# Patient Record
Sex: Female | Born: 1980 | Hispanic: No | Marital: Married | Smoking: Never smoker
Health system: Southern US, Community
[De-identification: ages and names within clinical notes are randomized; demographics above are authoritative.]

## PROBLEM LIST (undated history)

## (undated) ENCOUNTER — Inpatient Hospital Stay (HOSPITAL_COMMUNITY): Payer: Self-pay

## (undated) DIAGNOSIS — O24419 Gestational diabetes mellitus in pregnancy, unspecified control: Secondary | ICD-10-CM

## (undated) DIAGNOSIS — O3680X0 Pregnancy with inconclusive fetal viability, not applicable or unspecified: Secondary | ICD-10-CM

## (undated) DIAGNOSIS — E039 Hypothyroidism, unspecified: Secondary | ICD-10-CM

## (undated) HISTORY — PX: NO PAST SURGERIES: SHX2092

## (undated) HISTORY — DX: Pregnancy with inconclusive fetal viability, not applicable or unspecified: O36.80X0

---

## 2017-09-21 ENCOUNTER — Encounter (HOSPITAL_COMMUNITY): Payer: Self-pay | Admitting: *Deleted

## 2017-09-21 ENCOUNTER — Inpatient Hospital Stay (HOSPITAL_COMMUNITY): Payer: Medicaid Other

## 2017-09-21 ENCOUNTER — Inpatient Hospital Stay (HOSPITAL_COMMUNITY)
Admission: AD | Admit: 2017-09-21 | Discharge: 2017-09-21 | Disposition: A | Discharge status: Home / Self Care | Payer: Medicaid Other | Source: Ambulatory Visit | Attending: Obstetrics & Gynecology | Admitting: Obstetrics & Gynecology

## 2017-09-21 DIAGNOSIS — Z3A01 Less than 8 weeks gestation of pregnancy: Secondary | ICD-10-CM | POA: Diagnosis not present

## 2017-09-21 DIAGNOSIS — O26851 Spotting complicating pregnancy, first trimester: Secondary | ICD-10-CM | POA: Diagnosis not present

## 2017-09-21 DIAGNOSIS — O283 Abnormal ultrasonic finding on antenatal screening of mother: Secondary | ICD-10-CM | POA: Diagnosis not present

## 2017-09-21 DIAGNOSIS — O3680X0 Pregnancy with inconclusive fetal viability, not applicable or unspecified: Secondary | ICD-10-CM

## 2017-09-21 DIAGNOSIS — O209 Hemorrhage in early pregnancy, unspecified: Secondary | ICD-10-CM | POA: Diagnosis present

## 2017-09-21 DIAGNOSIS — O26859 Spotting complicating pregnancy, unspecified trimester: Secondary | ICD-10-CM | POA: Diagnosis present

## 2017-09-21 HISTORY — DX: Hypothyroidism, unspecified: E03.9

## 2017-09-21 HISTORY — DX: Pregnancy with inconclusive fetal viability, not applicable or unspecified: O36.80X0

## 2017-09-21 HISTORY — DX: Gestational diabetes mellitus in pregnancy, unspecified control: O24.419

## 2017-09-21 LAB — URINALYSIS, ROUTINE W REFLEX MICROSCOPIC
BACTERIA UA: NONE SEEN
Bilirubin Urine: NEGATIVE
GLUCOSE, UA: NEGATIVE mg/dL
Ketones, ur: NEGATIVE mg/dL
Leukocytes, UA: NEGATIVE
Nitrite: NEGATIVE
PH: 6 (ref 5.0–8.0)
Protein, ur: NEGATIVE mg/dL
Specific Gravity, Urine: 1.006 (ref 1.005–1.030)

## 2017-09-21 LAB — CBC
HCT: 34.9 % — ABNORMAL LOW (ref 36.0–46.0)
Hemoglobin: 11.4 g/dL — ABNORMAL LOW (ref 12.0–15.0)
MCH: 29 pg (ref 26.0–34.0)
MCHC: 32.7 g/dL (ref 30.0–36.0)
MCV: 88.8 fL (ref 78.0–100.0)
PLATELETS: 353 10*3/uL (ref 150–400)
RBC: 3.93 MIL/uL (ref 3.87–5.11)
RDW: 13.3 % (ref 11.5–15.5)
WBC: 8.8 10*3/uL (ref 4.0–10.5)

## 2017-09-21 LAB — POCT PREGNANCY, URINE: PREG TEST UR: POSITIVE — AB

## 2017-09-21 LAB — WET PREP, GENITAL
CLUE CELLS WET PREP: NONE SEEN
Sperm: NONE SEEN
TRICH WET PREP: NONE SEEN
Yeast Wet Prep HPF POC: NONE SEEN

## 2017-09-21 LAB — HCG, QUANTITATIVE, PREGNANCY: hCG, Beta Chain, Quant, S: 12527 m[IU]/mL — ABNORMAL HIGH (ref <5)

## 2017-09-21 LAB — ABO/RH: ABO/RH(D): AB POS

## 2017-09-21 NOTE — Discharge Instructions (Signed)
Please return to Maternity Admissions Unit for bleeding where you completely saturate a 1-2 pads every hour for a 2 hour period.

## 2017-09-21 NOTE — MAU Note (Signed)
Pt reports yesterday she had some pink discharge on the tissue when she wiped, the same thing happened twice today. Denies pain.

## 2017-09-21 NOTE — MAU Provider Note (Signed)
History     CSN: [id number]  Arrival date and time: 09/21/17 1445   First Provider Initiated Contact with Patient 09/21/17 1543      Chief Complaint  Patient presents with  . Possible Pregnancy  . Vaginal Bleeding   HPI  Ms.  Gloria Mendez is a 37 y.o. year old G35P0011 female at [redacted]w[redacted]d weeks gestation by LMP who presents to MAU reporting "some pink spotting yesterday with wiping, the same thing happened twice today, but is heavier than yesterday". She denies pain. She denies recent SI; last in mid-March.  Past Medical History:  Diagnosis Date  . Gestational diabetes   . Hypothyroidism     Past Surgical History:  Procedure Laterality Date  . NO PAST SURGERIES      History reviewed. No pertinent family history.  Social History   Tobacco Use  . Smoking status: Never Smoker  . Smokeless tobacco: Never Used  Substance Use Topics  . Alcohol use: Never    Frequency: Never  . Drug use: Never    Allergies: No Known Allergies  Medications Prior to Admission  Medication Sig Dispense Refill Last Dose  . levothyroxine (SYNTHROID, LEVOTHROID) 25 MCG tablet Take 25 mcg by mouth daily.  1 09/21/2017 at Unknown time  . Prenatal Vit-Fe Fumarate-FA (PRENATAL MULTIVITAMIN) TABS tablet Take 1 tablet by mouth daily at 12 noon.   09/20/2017 at Unknown time    Review of Systems  Constitutional: Negative.   HENT: Negative.   Eyes: Negative.   Respiratory: Negative.   Cardiovascular: Negative.   Gastrointestinal: Negative.   Endocrine: Negative.   Genitourinary: Positive for vaginal bleeding (pink spotting).  Musculoskeletal: Negative.   Skin: Negative.   Allergic/Immunologic: Negative.   Neurological: Negative.   Hematological: Negative.   Psychiatric/Behavioral: Negative.    Physical Exam   Blood pressure 111/62, pulse (!) 106, temperature 98.2 F (36.8 C), temperature source Oral, resp. rate 16, last menstrual period 08/15/2017, SpO2 100 %.  Physical Exam  Nursing note and  vitals reviewed. Constitutional: She is oriented to person, place, and time. She appears well-developed and well-nourished.  HENT:  Head: Normocephalic and atraumatic.  Eyes: Pupils are equal, round, and reactive to light.  Neck: Normal range of motion.  Cardiovascular: Normal rate, regular rhythm and normal heart sounds.  Respiratory: Effort normal and breath sounds normal.  GI: Soft. Bowel sounds are normal.  Genitourinary:  Genitourinary Comments: Uterus: non-tender, SE: cervix is smooth, pink, no lesions, scant amt of clear vaginal d/c -- WP, GC/CT done, closed/long/firm, no CMT or friability, no adnexal tenderness   Musculoskeletal: Normal range of motion.  Neurological: She is alert and oriented to person, place, and time.  Skin: Skin is warm and dry.  Psychiatric: She has a normal mood and affect. Her behavior is normal. Judgment and thought content normal.    MAU Course  Procedures  MDM CCUA UPT CBC w/Diff ABO/Rh Wet Prep GC/CT -- pending HIV -- pending  Results for orders placed or performed during the hospital encounter of 09/21/17 (from the past 24 hour(s))  Urinalysis, Routine w reflex microscopic     Status: Abnormal   Collection Time: 09/21/17  3:02 PM  Result Value Ref Range   Color, Urine STRAW (A) YELLOW   APPearance CLEAR CLEAR   Specific Gravity, Urine 1.006 1.005 - 1.030   pH 6.0 5.0 - 8.0   Glucose, UA NEGATIVE NEGATIVE mg/dL   Hgb urine dipstick SMALL (A) NEGATIVE   Bilirubin Urine NEGATIVE NEGATIVE  Ketones, ur NEGATIVE NEGATIVE mg/dL   Protein, ur NEGATIVE NEGATIVE mg/dL   Nitrite NEGATIVE NEGATIVE   Leukocytes, UA NEGATIVE NEGATIVE   RBC / HPF 0-5 0 - 5 RBC/hpf   WBC, UA 0-5 0 - 5 WBC/hpf   Bacteria, UA NONE SEEN NONE SEEN   Squamous Epithelial / LPF 0-5 (A) NONE SEEN  Pregnancy, urine POC     Status: Abnormal   Collection Time: 09/21/17  3:51 PM  Result Value Ref Range   Preg Test, Ur POSITIVE (A) NEGATIVE  Wet prep, genital     Status:  Abnormal   Collection Time: 09/21/17  3:55 PM  Result Value Ref Range   Yeast Wet Prep HPF POC NONE SEEN NONE SEEN   Trich, Wet Prep NONE SEEN NONE SEEN   Clue Cells Wet Prep HPF POC NONE SEEN NONE SEEN   WBC, Wet Prep HPF POC FEW (A) NONE SEEN   Sperm NONE SEEN   CBC     Status: Abnormal   Collection Time: 09/21/17  4:04 PM  Result Value Ref Range   WBC 8.8 4.0 - 10.5 K/uL   RBC 3.93 3.87 - 5.11 MIL/uL   Hemoglobin 11.4 (L) 12.0 - 15.0 g/dL   HCT 21.3 (L) 08.6 - 57.8 %   MCV 88.8 78.0 - 100.0 fL   MCH 29.0 26.0 - 34.0 pg   MCHC 32.7 30.0 - 36.0 g/dL   RDW 46.9 62.9 - 52.8 %   Platelets 353 150 - 400 K/uL  ABO/Rh     Status: None (Preliminary result)   Collection Time: 09/21/17  4:04 PM  Result Value Ref Range   ABO/RH(D)      AB POS Performed at [facility], [address], [place], [place] [address]   hCG, quantitative, pregnancy     Status: Abnormal   Collection Time: 09/21/17  4:04 PM  Result Value Ref Range   hCG, Beta Chain, Quant, S 12,527 (H) <5 mIU/mL    US Ob Less Than 14 Weeks With Ob Transvaginal  Result Date: 09/21/2017 CLINICAL DATA:  Pregnant patient with vaginal spotting. Patient is 5 weeks and 2 days pregnant based on her last menstrual period. EXAM: OBSTETRIC <14 WK [place] AND TRANSVAGINAL OB US TECHNIQUE: Both transabdominal and transvaginal ultrasound examinations were performed for complete evaluation of the gestation as well as the maternal uterus, adnexal regions, and pelvic cul-de-sac. Transvaginal technique was performed to assess early pregnancy. COMPARISON:  None. FINDINGS: Intrauterine gestational sac: Single Yolk sac:  Visualized. Embryo:  Not Visualized. Cardiac Activity: Not Visualized. MSD: 8.3 mm mm   5 w   4 d Subchorionic hemorrhage:  None visualized. Maternal uterus/adnexae: No uterine masses. Cervix is closed. Left ovary demonstrates a corpus luteum. Ovaries otherwise unremarkable. No adnexal masses. No pelvic free fluid. IMPRESSION: 1.  Intrauterine gestational sac containing a yolk sac consistent with an early intrauterine pregnancy. Embryo not visualized. Recommend follow-up ultrasound in 7-10 days to document normal pregnancy progression. 2. No subchronic hemorrhage or other evidence of a pregnancy complication. Electronically Signed   By: Amie Portland M.D.   On: 09/21/2017 16:37    Assessment and Plan  Pregnancy of unknown anatomic location  - Information provided on ectopic pregnancy - F/U with [facility] on Wednesday 09/24/17 @ 0900 for repeat HCG - Advised to arrive at 8:45 AM and expect to wait for at least 2 hrs for results and to speak to someone about POC  Spotting affecting pregnancy in first trimester  -  Information provided on threatened miscarriage Advised to return to MAU for bleeding that saturates 1-2 pads eery hour x 2 hrs  - Discharge patient - Patient verbalized an understanding of the plan of care and agrees.   Raelyn Moraolitta Elida Harbin MSN, CNM 09/21/2017, 3:49 PM

## 2017-09-22 LAB — HIV ANTIBODY (ROUTINE TESTING W REFLEX): HIV SCREEN 4TH GENERATION: NONREACTIVE

## 2017-09-22 LAB — GC/CHLAMYDIA PROBE AMP (~~LOC~~) NOT AT ARMC
CHLAMYDIA, DNA PROBE: NEGATIVE
NEISSERIA GONORRHEA: NEGATIVE

## 2017-09-22 LAB — POCT PREGNANCY, URINE: PREG TEST UR: POSITIVE — AB

## 2017-09-24 ENCOUNTER — Ambulatory Visit (INDEPENDENT_AMBULATORY_CARE_PROVIDER_SITE_OTHER): Payer: Self-pay

## 2017-09-24 DIAGNOSIS — O4690 Antepartum hemorrhage, unspecified, unspecified trimester: Secondary | ICD-10-CM

## 2017-09-24 LAB — HCG, QUANTITATIVE, PREGNANCY: HCG, BETA CHAIN, QUANT, S: 27588 m[IU]/mL — AB (ref <5)

## 2017-09-24 NOTE — Progress Notes (Signed)
Notified Gloria Mendez, CNM pt's beta results.  Provider recommendation for pt to have US in one week.  Informed pt of provider's recommendation.  April 18th @ 0900.  Pt notified of appt and verbalized understanding with no further questions.

## 2017-09-24 NOTE — Progress Notes (Signed)
Patient here for stat bhcg today. Patient reports bleeding has stopped since MAU visit. Patient denies pain. Discussed with patient we are monitoring her beta hcg levels today and asked she wait in lobby for results/updated plan of care. Patient verbalized understanding & had no questions at this time.

## 2017-10-02 ENCOUNTER — Encounter: Payer: Self-pay | Admitting: Family Medicine

## 2017-10-02 ENCOUNTER — Ambulatory Visit (INDEPENDENT_AMBULATORY_CARE_PROVIDER_SITE_OTHER): Payer: Self-pay | Admitting: General Practice

## 2017-10-02 ENCOUNTER — Ambulatory Visit (HOSPITAL_COMMUNITY)
Admission: RE | Admit: 2017-10-02 | Discharge: 2017-10-02 | Disposition: A | Discharge status: Home / Self Care | Payer: Medicaid Other | Source: Ambulatory Visit

## 2017-10-02 DIAGNOSIS — Z3A01 Less than 8 weeks gestation of pregnancy: Secondary | ICD-10-CM | POA: Diagnosis not present

## 2017-10-02 DIAGNOSIS — O4690 Antepartum hemorrhage, unspecified, unspecified trimester: Secondary | ICD-10-CM | POA: Diagnosis present

## 2017-10-02 DIAGNOSIS — Z712 Person consulting for explanation of examination or test findings: Secondary | ICD-10-CM

## 2017-10-02 NOTE — Progress Notes (Signed)
Patient here for viability results today. Reviewed ultrasound report with Luna KitchensKathryn Kooistra who finds living IUP, patient should begin prenatal care.   Informed patient of results, reviewed dating, & recommended she begin OB care. Provided pictures. Patient verbalized understanding to all & had no questions.

## 2017-10-02 NOTE — Progress Notes (Signed)
Chart reviewed for nurse visit. Agree with plan of care.   Marylene LandKooistra, Kathryn Lorraine, CNM 10/02/2017 10:10 AM

## 2017-10-16 ENCOUNTER — Encounter: Payer: Self-pay | Admitting: General Practice

## 2017-10-16 ENCOUNTER — Other Ambulatory Visit (HOSPITAL_COMMUNITY)
Admission: RE | Admit: 2017-10-16 | Discharge: 2017-10-16 | Disposition: A | Discharge status: Home / Self Care | Payer: Medicaid Other | Source: Ambulatory Visit | Attending: Advanced Practice Midwife | Admitting: Advanced Practice Midwife

## 2017-10-16 ENCOUNTER — Encounter: Payer: Self-pay | Admitting: Advanced Practice Midwife

## 2017-10-16 ENCOUNTER — Ambulatory Visit (INDEPENDENT_AMBULATORY_CARE_PROVIDER_SITE_OTHER): Payer: Medicaid Other | Admitting: Advanced Practice Midwife

## 2017-10-16 VITALS — BP 119/79 | HR 118 | Temp 98.5°F | Ht 60.0 in | Wt 132.6 lb

## 2017-10-16 DIAGNOSIS — O26859 Spotting complicating pregnancy, unspecified trimester: Secondary | ICD-10-CM

## 2017-10-16 DIAGNOSIS — O09529 Supervision of elderly multigravida, unspecified trimester: Secondary | ICD-10-CM

## 2017-10-16 DIAGNOSIS — Z3A08 8 weeks gestation of pregnancy: Secondary | ICD-10-CM | POA: Diagnosis not present

## 2017-10-16 DIAGNOSIS — O0991 Supervision of high risk pregnancy, unspecified, first trimester: Secondary | ICD-10-CM | POA: Diagnosis not present

## 2017-10-16 DIAGNOSIS — O99281 Endocrine, nutritional and metabolic diseases complicating pregnancy, first trimester: Secondary | ICD-10-CM | POA: Insufficient documentation

## 2017-10-16 DIAGNOSIS — O09521 Supervision of elderly multigravida, first trimester: Secondary | ICD-10-CM | POA: Insufficient documentation

## 2017-10-16 DIAGNOSIS — O09291 Supervision of pregnancy with other poor reproductive or obstetric history, first trimester: Secondary | ICD-10-CM

## 2017-10-16 DIAGNOSIS — Z8632 Personal history of gestational diabetes: Secondary | ICD-10-CM

## 2017-10-16 DIAGNOSIS — E039 Hypothyroidism, unspecified: Secondary | ICD-10-CM | POA: Diagnosis not present

## 2017-10-16 DIAGNOSIS — Z79890 Hormone replacement therapy (postmenopausal): Secondary | ICD-10-CM | POA: Insufficient documentation

## 2017-10-16 DIAGNOSIS — O26851 Spotting complicating pregnancy, first trimester: Secondary | ICD-10-CM | POA: Insufficient documentation

## 2017-10-16 DIAGNOSIS — O0990 Supervision of high risk pregnancy, unspecified, unspecified trimester: Secondary | ICD-10-CM

## 2017-10-16 DIAGNOSIS — O09299 Supervision of pregnancy with other poor reproductive or obstetric history, unspecified trimester: Secondary | ICD-10-CM

## 2017-10-16 MED ORDER — PRENATAL FORMULA 27-1 MG PO TABS: 1.0000 | ORAL_TABLET | Freq: Every day | ORAL | 11 refills | Status: AC

## 2017-10-16 NOTE — Progress Notes (Signed)
Subjective:   Gloria Mendez is a 37 y.o. G4P1011 at [redacted]w[redacted]d by LMP, early ultrasound being seen today for her first obstetrical visit.  Her obstetrical history is significant for advanced maternal age and hypothyroid  History of gestational diabetes with last pregnancy. Patient does intend to breast feed. Pregnancy history fully reviewed.   Currently on synthroid q day, getting rx from her Dr in [place], but has not had lab work since 2017.   Patient reports nausea, vomiting and food aversion.  HISTORY: OB History  Gravida Para Term Preterm AB Living  0 1 1  SAB TAB Ectopic Multiple Live Births  1 0 0 0 1    # Outcome Date GA Lbr Len/2nd Weight Sex Delivery Anes PTL Lv  3 Current           2 Term 06/23/15 [redacted]w[redacted]d   F  EPI  LIV     Name: Reeva   1 SAB 10/15/13            Last pap smear was done unsure and was does not recall any past abnormal paps.   Past Medical History:  Diagnosis Date  . Gestational diabetes   . Hypothyroidism   . Pregnancy of unknown anatomic location 09/21/2017   Past Surgical History:  Procedure Laterality Date  . NO PAST SURGERIES     History reviewed. No pertinent family history. Social History   Tobacco Use  . Smoking status: Never Smoker  . Smokeless tobacco: Never Used  Substance Use Topics  . Alcohol use: Never    Frequency: Never  . Drug use: Never   No Known Allergies Current Outpatient Medications on File Prior to Visit  Medication Sig Dispense Refill  . levothyroxine (SYNTHROID, LEVOTHROID) 25 MCG tablet Take 25 mcg by mouth daily.  1  . Prenatal Vit-Fe Fumarate-FA (PRENATAL MULTIVITAMIN) TABS tablet Take 1 tablet by mouth daily at 12 noon.     No current facility-administered medications on file prior to visit.     Review of Systems Pertinent items noted in HPI and remainder of comprehensive ROS otherwise negative.  Exam   Vitals:   10/16/17 1034 10/16/17 1036  BP: 119/79   Pulse: (!) 118   Temp: 98.5 F (36.9  C)   Weight: 132 lb 9.6 oz (60.1 kg)   Height:  5' (1.524 m)      Uterus:   8 weeks size, +FHT 180 with doppler today   Pelvic Exam: Perineum: no hemorrhoids, normal perineum   Vulva: normal external genitalia, no lesions   Vagina:  normal mucosa, normal discharge   Cervix: ? Cervical polyp and some bleeding after pap, pap smear done.    Adnexa: normal adnexa and no mass, fullness, tenderness   Bony Pelvis: average  System: General: well-developed, well-nourished female in no acute distress   Breast:  normal appearance, no masses or tenderness   Skin: normal coloration and turgor, no rashes, large amount of scarring on right arm    Neurologic: oriented, normal, negative, normal mood   Extremities: normal strength, tone, and muscle mass, ROM of all joints is normal   HEENT PERRLA, extraocular movement intact and sclera clear, anicteric   Mouth/Teeth mucous membranes moist, pharynx normal without lesions and dental hygiene good   Neck supple and no masses   Cardiovascular: regular rate and rhythm   Respiratory:  no respiratory distress, normal breath sounds   Abdomen: soft, non-tender; bowel sounds normal; no masses,  no organomegaly  Assessment:   Pregnancy: [id number] Patient Active Problem List   Diagnosis Date Noted  . Supervision of high risk pregnancy, antepartum 10/16/2017  . Advanced maternal age in multigravida 10/16/2017  . Hypothyroidism 10/16/2017  . History of gestational diabetes in prior pregnancy, currently pregnant 10/16/2017  . Spotting affecting pregnancy 09/21/2017     Plan:  1. Supervision of high risk pregnancy, antepartum - Obstetric Panel, Including HIV - SMN1 Copy Number Analysis - Cystic Fibrosis Mutation 97 - Hemoglobinopathy Evaluation - TSH - Cytology - PAP - Culture, OB Urine - Discussed genetic screening options. Patient is interested and plans to do something. Will discuss options, and let us know at her next visit.   2. Antepartum  multigravida of advanced maternal age - Not over 40 so no additional testing required per our guidelines   3. Hypothyroidism, unspecified type - TSH  4. History of gestational diabetes, currently pregnant  - Hgb A1C   Initial labs drawn. Continue prenatal vitamins. Genetic Screening discussed, First trimester screen, Quad screen and NIPS: patient will discuss with husband, and let us know. Thinking about FIRST screen.  Ultrasound discussed; fetal anatomic survey: requested. Problem list reviewed and updated. The nature of [facility] with multiple MDs and other Advanced Practice Providers was explained to patient; also emphasized that residents, students are part of our team. Routine obstetric precautions reviewed. Return in about 1 month (around 11/13/2017).

## 2017-10-16 NOTE — Patient Instructions (Addendum)
Safe Medications in Pregnancy   Acne: Benzoyl Peroxide Salicylic Acid  Backache/Headache: Tylenol: 2 regular strength every 4 hours OR              2 Extra strength every 6 hours  Colds/Coughs/Allergies: Benadryl (alcohol free) 25 mg every 6 hours as needed Breath right strips Claritin Cepacol throat lozenges Chloraseptic throat spray Cold-Eeze- up to three times per day Cough drops, alcohol free Flonase (by prescription only) Guaifenesin Mucinex Robitussin DM (plain only, alcohol free) Saline nasal spray/drops Sudafed (pseudoephedrine) & Actifed ** use only after [redacted] weeks gestation and if you do not have high blood pressure Tylenol Vicks Vaporub Zinc lozenges Zyrtec   Constipation: Colace Ducolax suppositories Fleet enema Glycerin suppositories Metamucil Milk of magnesia Miralax Senokot Smooth move tea  Diarrhea: Kaopectate Imodium A-D  *NO pepto Bismol  Hemorrhoids: Anusol Anusol HC Preparation H Tucks  Indigestion: Tums Maalox Mylanta Zantac  Pepcid  Insomnia: Benadryl (alcohol free) 25mg every 6 hours as needed Tylenol PM Unisom, no Gelcaps  Leg Cramps: Tums MagGel  Nausea/Vomiting:  Bonine Dramamine Emetrol Ginger extract Sea bands Meclizine  Nausea medication to take during pregnancy:  Unisom (doxylamine succinate 25 mg tablets) Take one tablet daily at bedtime. If symptoms are not adequately controlled, the dose can be increased to a maximum recommended dose of two tablets daily (1/2 tablet in the morning, 1/2 tablet mid-afternoon and one at bedtime). Vitamin B6 100mg tablets. Take one tablet twice a day (up to 200 mg per day).  Skin Rashes: Aveeno products Benadryl cream or 25mg every 6 hours as needed Calamine Lotion 1% cortisone cream  Yeast infection: Gyne-lotrimin 7 Monistat 7   **If taking multiple medications, please check labels to avoid duplicating the same active ingredients **take medication as directed on  the label ** Do not exceed 4000 mg of tylenol in 24 hours **Do not take medications that contain aspirin or ibuprofen    AREA PEDIATRIC/FAMILY PRACTICE PHYSICIANS  [facility] [address] [place], [place]  [address] Phone - [phone]   Fax - [phone]  [facility] [address] [place], [place] [address] Phone - [phone]   Fax - [phone]  [facility] [address] [place], [place]  [address] Phone - [phone]   Fax - [phone]  [facility] [address] [place], [place]  [address] Phone - [phone]   Fax - [phone]  [facility] [address] [place], [place]  [address] Phone - [phone]   Fax - [phone]  [facility] [address] [place], [place]  [address] Phone - [phone]   Fax - [phone]  [facility] [address] [place], [place]  [address] Phone - [phone]   Fax - [phone]  [facility] [address] [place], [place]  [address] Phone - [phone]   Fax - [phone]  [facility] [address] [place], [place]  [address] Phone - [phone]   Fax - [phone] [facility] [address] [place], [place]  [address] Phone - [phone]   Fax - [phone]  [facility] [address] [place], [place]  [address] Phone - [phone]   Fax - [phone]  [facility] [address] , [place]  [address] Phone - [phone]   Fax -   [phone]  [facility] [address] [place], [place]  [address] Phone - [phone]   Fax - [phone]  [facility] [address] [place], [place]  [address] Phone - [phone]  [facility] [address] [place], [place]  [address] Phone - [phone]   Fax - [phone]  [facility] [address] [place], [place]  [address] Phone - [phone]   Fax - [phone]  [facility] [address] [place], [place]  [address] Phone - [phone]   Fax - [phone]  [facility] [address] [place], [place]  [address] Phone - [phone]   Fax - [phone]   [facility] [address] [place], [place]  [address] Phone - [phone]   Fax - [phone]  [facility] [address] [place], [place]  [address] Phone - [phone]   Fax - [phone]  [facility] [address] [place], [place]  [address] Phone - [phone]   Fax - [phone]  [facility] [address] [place], [place]  [address] Phone - [phone]   Fax - [phone]  [facility] [address] , [place]  [address] Phone - [phone]   Fax - [phone] [facility] [address] [place], [place]  [address] Phone - [phone]   Fax - [phone]  [facility] [address] [place], [place]  [address] Phone - [phone]   Fax - [phone]  [facility] [address] [place],   [address] Phone - [phone]   Fax - [phone]  [facility] Charlene [place]ing MD [address] [place]  [address] Phone [phone]  Fax [phone]   

## 2017-10-17 ENCOUNTER — Telehealth: Payer: Self-pay | Admitting: General Practice

## 2017-10-17 LAB — SPECIMEN STATUS REPORT

## 2017-10-17 LAB — CYTOLOGY - PAP
Chlamydia: NEGATIVE
Diagnosis: NEGATIVE
HPV: NOT DETECTED
Neisseria Gonorrhea: NEGATIVE

## 2017-10-17 NOTE — Telephone Encounter (Signed)
Called patient to schedule 2hr gtts at [facility] on 01/29/18 at 8:50am.  Patient voiced understanding.

## 2017-10-21 LAB — CULTURE, OB URINE

## 2017-10-22 LAB — HEMOGLOBINOPATHY EVALUATION
Ferritin: 131 ng/mL (ref 15–150)
HGB S: 0 %
HGB SOLUBILITY: NEGATIVE
HGB VARIANT: 0 %
Hgb A2 Quant: 1.8 % (ref 1.8–3.2)
Hgb A: 98.2 % (ref 96.4–98.8)
Hgb C: 0 %
Hgb F Quant: 0 % (ref 0.0–2.0)

## 2017-10-22 LAB — OBSTETRIC PANEL, INCLUDING HIV
ANTIBODY SCREEN: NEGATIVE
BASOS: 0 %
Basophils Absolute: 0 10*3/uL (ref 0.0–0.2)
EOS (ABSOLUTE): 0 10*3/uL (ref 0.0–0.4)
EOS: 0 %
HEMOGLOBIN: 11.4 g/dL (ref 11.1–15.9)
HIV SCREEN 4TH GENERATION: NONREACTIVE
Hematocrit: 35.2 % (ref 34.0–46.6)
Hepatitis B Surface Ag: NEGATIVE
IMMATURE GRANS (ABS): 0 10*3/uL (ref 0.0–0.1)
Immature Granulocytes: 0 %
LYMPHS: 25 %
Lymphocytes Absolute: 2 10*3/uL (ref 0.7–3.1)
MCH: 29.1 pg (ref 26.6–33.0)
MCHC: 32.4 g/dL (ref 31.5–35.7)
MCV: 90 fL (ref 79–97)
MONOCYTES: 4 %
Monocytes Absolute: 0.4 10*3/uL (ref 0.1–0.9)
NEUTROS ABS: 5.7 10*3/uL (ref 1.4–7.0)
Neutrophils: 71 %
Platelets: 377 10*3/uL (ref 150–379)
RBC: 3.92 x10E6/uL (ref 3.77–5.28)
RDW: 14 % (ref 12.3–15.4)
RPR Ser Ql: NONREACTIVE
RUBELLA: 2.17 {index} (ref >0.99)
Rh Factor: POSITIVE
WBC: 8.1 10*3/uL (ref 3.4–10.8)

## 2017-10-22 LAB — SMN1 COPY NUMBER ANALYSIS (SMA CARRIER SCREENING)

## 2017-10-22 LAB — CYSTIC FIBROSIS MUTATION 97: Interpretation: NOT DETECTED

## 2017-10-22 LAB — HEMOGLOBIN A1C
ESTIMATED AVERAGE GLUCOSE: 126 mg/dL
HEMOGLOBIN A1C: 6 % — AB (ref 4.8–5.6)

## 2017-10-22 LAB — TSH: TSH: 1.33 u[IU]/mL (ref 0.450–4.500)

## 2017-10-28 ENCOUNTER — Other Ambulatory Visit: Payer: Self-pay | Admitting: *Deleted

## 2017-10-28 DIAGNOSIS — O09299 Supervision of pregnancy with other poor reproductive or obstetric history, unspecified trimester: Secondary | ICD-10-CM

## 2017-10-28 DIAGNOSIS — Z8632 Personal history of gestational diabetes: Secondary | ICD-10-CM

## 2017-10-28 DIAGNOSIS — O0990 Supervision of high risk pregnancy, unspecified, unspecified trimester: Secondary | ICD-10-CM

## 2017-10-29 ENCOUNTER — Other Ambulatory Visit: Payer: Medicaid Other

## 2017-10-29 DIAGNOSIS — O09299 Supervision of pregnancy with other poor reproductive or obstetric history, unspecified trimester: Secondary | ICD-10-CM

## 2017-10-29 DIAGNOSIS — O0990 Supervision of high risk pregnancy, unspecified, unspecified trimester: Secondary | ICD-10-CM

## 2017-10-29 DIAGNOSIS — Z8632 Personal history of gestational diabetes: Secondary | ICD-10-CM

## 2017-10-30 ENCOUNTER — Other Ambulatory Visit: Payer: Self-pay | Admitting: Advanced Practice Midwife

## 2017-10-30 DIAGNOSIS — O24419 Gestational diabetes mellitus in pregnancy, unspecified control: Secondary | ICD-10-CM

## 2017-10-30 LAB — GLUCOSE TOLERANCE, 2 HOURS W/ 1HR
GLUCOSE, 1 HOUR: 197 mg/dL — AB (ref 65–179)
GLUCOSE, 2 HOUR: 164 mg/dL — AB (ref 65–152)
GLUCOSE, FASTING: 83 mg/dL (ref 65–91)

## 2017-11-06 ENCOUNTER — Other Ambulatory Visit: Payer: Self-pay

## 2017-11-06 MED ORDER — ACCU-CHEK GUIDE W/DEVICE KIT: 1.0000 | PACK | Freq: Four times a day (QID) | 0 refills | Status: AC

## 2017-11-06 MED ORDER — GLUCOSE BLOOD VI STRP: ORAL_STRIP | 12 refills | Status: AC

## 2017-11-06 MED ORDER — ACCU-CHEK FASTCLIX LANCETS MISC: 1.0000 | Freq: Four times a day (QID) | 12 refills | Status: AC

## 2017-11-13 ENCOUNTER — Other Ambulatory Visit: Payer: Medicaid Other

## 2017-11-13 ENCOUNTER — Ambulatory Visit (INDEPENDENT_AMBULATORY_CARE_PROVIDER_SITE_OTHER): Payer: Medicaid Other | Admitting: Advanced Practice Midwife

## 2017-11-13 ENCOUNTER — Encounter: Payer: Self-pay | Admitting: Advanced Practice Midwife

## 2017-11-13 VITALS — BP 115/68 | Wt 132.0 lb

## 2017-11-13 DIAGNOSIS — O0990 Supervision of high risk pregnancy, unspecified, unspecified trimester: Secondary | ICD-10-CM

## 2017-11-13 DIAGNOSIS — O24419 Gestational diabetes mellitus in pregnancy, unspecified control: Secondary | ICD-10-CM

## 2017-11-13 NOTE — Progress Notes (Addendum)
   PRENATAL VISIT NOTE  Subjective:  Gloria Mendez is a 37 y.o. G3P1011 at [redacted]w[redacted]d being seen today for ongoing prenatal care.  She is currently monitored for the following issues for this high-risk pregnancy and has Spotting affecting pregnancy; Supervision of high risk pregnancy, antepartum; Advanced maternal age in multigravida; Hypothyroidism; History of gestational diabetes in prior pregnancy, currently pregnant; and Gestational diabetes on their problem list.  Patient reports no complaints.   . Vag. Bleeding: None.   . Denies leaking of fluid.   The following portions of the patient's history were reviewed and updated as appropriate: allergies, current medications, past family history, past medical history, past social history, past surgical history and problem list. Problem list updated.  Objective:   Vitals:   11/13/17 1037  BP: 115/68  Weight: 132 lb (59.9 kg)    Fetal Status: Fetal Heart Rate (bpm): 160 Fundal Height: 12 cm       General:  Alert, oriented and cooperative. Patient is in no acute distress.  Skin: Skin is warm and dry. No rash noted.   Cardiovascular: Normal heart rate noted  Respiratory: Normal respiratory effort, no problems with respiration noted  Abdomen: Soft, gravid, appropriate for gestational age.  Pain/Pressure: Absent     Pelvic: Cervical exam deferred        Extremities: Normal range of motion.     Mental Status: Normal mood and affect. Normal behavior. Normal judgment and thought content.   GB log: [place] 92-99  (7 days worth and all >90) 2 hour PP: 78-123 (2/19 out of range) Usually eating around 9:30 pm  Assessment and Plan:  Pregnancy: G3P1011 at [redacted]w[redacted]d  1. Supervision of high risk pregnancy, antepartum - [place] MFM OB DETAIL +14 WK; Future - Panorama today   2. Gestational diabetes mellitus (GDM), antepartum, gestational diabetes method of control unspecified - Has appt with DM educator on 11/25/17 - Enrolled in baby scripts glucose program     Preterm labor symptoms and general obstetric precautions including but not limited to vaginal bleeding, contractions, leaking of fluid and fetal movement were reviewed in detail with the patient. Please refer to After Visit Summary for other counseling recommendations.  Return in about 1 month (around 12/11/2017).  Future Appointments  Date Time Provider Department Center  11/25/2017 11:00 AM [facility]EDUCATION [facility]  12/11/2017  9:30 AM Anyanwu, Jethro Bastos, MD [facility] None  12/25/2017  8:30 AM [facility]FC [place] 1 [facility]-MFCUS [facility]    Thressa Sheller, CNM

## 2017-11-13 NOTE — Patient Instructions (Signed)

## 2017-11-14 ENCOUNTER — Encounter: Payer: Self-pay | Admitting: General Practice

## 2017-11-16 ENCOUNTER — Encounter: Payer: Self-pay | Admitting: Obstetrics & Gynecology

## 2017-11-16 ENCOUNTER — Encounter: Payer: Self-pay | Admitting: Advanced Practice Midwife

## 2017-11-16 ENCOUNTER — Encounter (HOSPITAL_COMMUNITY): Payer: Self-pay

## 2017-11-16 ENCOUNTER — Other Ambulatory Visit: Payer: Self-pay

## 2017-11-16 ENCOUNTER — Inpatient Hospital Stay (HOSPITAL_COMMUNITY)
Admission: AD | Admit: 2017-11-16 | Discharge: 2017-11-16 | Disposition: A | Discharge status: Home / Self Care | Payer: Medicaid Other | Source: Ambulatory Visit | Attending: Family Medicine | Admitting: Family Medicine

## 2017-11-16 DIAGNOSIS — N841 Polyp of cervix uteri: Secondary | ICD-10-CM | POA: Insufficient documentation

## 2017-11-16 DIAGNOSIS — Z79899 Other long term (current) drug therapy: Secondary | ICD-10-CM | POA: Insufficient documentation

## 2017-11-16 DIAGNOSIS — E039 Hypothyroidism, unspecified: Secondary | ICD-10-CM | POA: Insufficient documentation

## 2017-11-16 DIAGNOSIS — Z79890 Hormone replacement therapy (postmenopausal): Secondary | ICD-10-CM | POA: Insufficient documentation

## 2017-11-16 DIAGNOSIS — O09299 Supervision of pregnancy with other poor reproductive or obstetric history, unspecified trimester: Secondary | ICD-10-CM

## 2017-11-16 DIAGNOSIS — Z8632 Personal history of gestational diabetes: Secondary | ICD-10-CM

## 2017-11-16 DIAGNOSIS — O24419 Gestational diabetes mellitus in pregnancy, unspecified control: Secondary | ICD-10-CM

## 2017-11-16 DIAGNOSIS — N939 Abnormal uterine and vaginal bleeding, unspecified: Secondary | ICD-10-CM | POA: Diagnosis present

## 2017-11-16 LAB — URINALYSIS, ROUTINE W REFLEX MICROSCOPIC
Bilirubin Urine: NEGATIVE
Glucose, UA: NEGATIVE mg/dL
Hgb urine dipstick: NEGATIVE
Ketones, ur: NEGATIVE mg/dL
Leukocytes, UA: NEGATIVE
NITRITE: NEGATIVE
PH: 7 (ref 5.0–8.0)
Protein, ur: NEGATIVE mg/dL
SPECIFIC GRAVITY, URINE: 1.012 (ref 1.005–1.030)

## 2017-11-16 LAB — WET PREP, GENITAL
CLUE CELLS WET PREP: NONE SEEN
SPERM: NONE SEEN
TRICH WET PREP: NONE SEEN
Yeast Wet Prep HPF POC: NONE SEEN

## 2017-11-16 NOTE — MAU Provider Note (Signed)
Patient Gloria Mendez is a 37 y.o. G3P1011 At 56w2dhere with complaints of vaginal bleeding twice last night. She denies pain; she endorses some stringy discharge this afternoon.  She was not planning to come into the MAU because her bleeding stopped overnight but she came this afternoon after she saw the discharge.  History     CSN: [id number] Arrival date and time: 11/16/17 1330   First Provider Initiated Contact with Patient 11/16/17 1616      Chief Complaint  Patient presents with  . Vaginal Bleeding   Vaginal Bleeding  The patient's primary symptoms include vaginal bleeding. The patient's pertinent negatives include no genital itching or genital lesions. This is a new problem. The current episode started yesterday. The problem occurs rarely. The problem has been resolved. Associated symptoms include constipation. Pertinent negatives include no diarrhea. The vaginal discharge was bloody. The vaginal bleeding is spotting. She has not been passing clots. She has not been passing tissue. Nothing aggravates the symptoms. She has tried nothing for the symptoms.  She also had some stringy discharge, white, just one time. She denies itching, odor, NV.   OB History    Gravida  3   Para  1   Term  1   Preterm      AB  1   Living  1     SAB  1   TAB      Ectopic      Multiple      Live Births  1           Past Medical History:  Diagnosis Date  . Gestational diabetes    current and last pregnancy  . Hypothyroidism    levothyroxine once per day   . Pregnancy of unknown anatomic location 09/21/2017    Past Surgical History:  Procedure Laterality Date  . NO PAST SURGERIES      Family History  Problem Relation Age of Onset  . Diabetes Father     Social History   Tobacco Use  . Smoking status: Never Smoker  . Smokeless tobacco: Never Used  Substance Use Topics  . Alcohol use: Never    Frequency: Never  . Drug use: Never    Allergies: No Known  Allergies  Medications Prior to Admission  Medication Sig Dispense Refill Last Dose  . ACCU-CHEK FASTCLIX LANCETS MISC 1 Device by Percutaneous route 4 (four) times daily. 100 each 12   . Blood Glucose Monitoring Suppl (ACCU-CHEK GUIDE) w/Device KIT 1 Device by Does not apply route 4 (four) times daily. 1 kit 0   . glucose blood (ACCU-CHEK GUIDE) test strip Use as instructed 100 each 12   . levothyroxine (SYNTHROID, LEVOTHROID) 25 MCG tablet Take 25 mcg by mouth daily.  1 Taking  . Prenatal Vit-Fe Fumarate-FA (PRENATAL FORMULA) 27-1 MG tablet Take 1 tablet by mouth daily. 30 tablet 11   . Prenatal Vit-Fe Fumarate-FA (PRENATAL MULTIVITAMIN) TABS tablet Take 1 tablet by mouth daily at 12 noon.   Taking    Review of Systems  HENT: Negative.   Respiratory: Negative.   Gastrointestinal: Positive for constipation. Negative for diarrhea.  Genitourinary: Positive for vaginal bleeding.  Musculoskeletal: Negative.   Neurological: Negative.    Physical Exam   Blood pressure 107/65, pulse 90, temperature 98.4 F (36.9 C), temperature source Oral, resp. rate 18, height 5' (1.524 m), weight 129 lb 8 oz (58.7 kg), last menstrual period 08/15/2017, SpO2 100 %, unknown if currently breastfeeding.  Physical Exam  Constitutional: She is oriented to person, place, and time. She appears well-developed.  HENT:  Head: Normocephalic.  GI: Soft. She exhibits no distension. There is no tenderness.  Genitourinary:  Genitourinary Comments: Normal external female genitalia; no blood or discharge in the vagina. Cervical polyp that is slightly friable; none-tender. No CTM, suprapubic or adnexal tenderness.   Musculoskeletal: Normal range of motion.  Neurological: She is alert and oriented to person, place, and time.  Skin: Skin is warm and dry.  Psychiatric: She has a normal mood and affect.    MAU Course  Procedures  MDM -UA negative -FHR is 167 by doppler -wet prep negative -cervcical polyp bleeds  when touched with faux swab but becomes hemostatic immediately.  Assessment and Plan  1. Cervical polyp  2. Explained to patient that cervical polyp may lead to occasional bleeding; explained bleeding precautions, signs of miscarriage and when to come to MAU.   3. Keep next prenatal visit.   4. Return to MAU if bleeding changes or worsens.   Mervyn Skeeters Jennefer Kopp 11/16/2017, 4:17 PM

## 2017-11-16 NOTE — Discharge Instructions (Signed)
Vaginal Bleeding During Pregnancy, Second Trimester °A small amount of bleeding (spotting) from the vagina is relatively common in pregnancy. It usually stops on its own. Various things can cause bleeding or spotting in pregnancy. Some bleeding may be related to the pregnancy, and some may not. Sometimes the bleeding is normal and is not a problem. However, bleeding can also be a sign of something serious. Be sure to tell your health care provider about any vaginal bleeding right away. °Some possible causes of vaginal bleeding during the second trimester include: °· Infection, inflammation, or growths on the cervix. °· The placenta may be partially or completely covering the opening of the cervix inside the uterus (placenta previa). °· The placenta may have separated from the uterus (abruption of the placenta). °· You may be having early (preterm) labor. °· The cervix may not be strong enough to keep a baby inside the uterus (cervical insufficiency). °· Tiny cysts may have developed in the uterus instead of pregnancy tissue (molar pregnancy). ° °Follow these instructions at home: °Watch your condition for any changes. The following actions may help to lessen any discomfort you are feeling: °· Follow your health care provider's instructions for limiting your activity. If your health care provider orders bed rest, you may need to stay in bed and only get up to use the bathroom. However, your health care provider may allow you to continue light activity. °· If needed, make plans for someone to help with your regular activities and responsibilities while you are on bed rest. °· Keep track of the number of pads you use each day, how often you change pads, and how soaked (saturated) they are. Write this down. °· Do not use tampons. Do not douche. °· Do not have sexual intercourse or orgasms until approved by your health care provider. °· If you pass any tissue from your vagina, save the tissue so you can show it to your  health care provider. °· Only take over-the-counter or prescription medicines as directed by your health care provider. °· Do not take aspirin because it can make you bleed. °· Do not exercise or perform any strenuous activities or heavy lifting without your health care provider's permission. °· Keep all follow-up appointments as directed by your health care provider. ° °Contact a health care provider if: °· You have any vaginal bleeding during any part of your pregnancy. °· You have cramps or labor pains. °· You have a fever, not controlled by medicine. °Get help right away if: °· You have severe cramps in your back or belly (abdomen). °· You have contractions. °· You have chills. °· You pass large clots or tissue from your vagina. °· Your bleeding increases. °· You feel light-headed or weak, or you have fainting episodes. °· You are leaking fluid or have a gush of fluid from your vagina. °This information is not intended to replace advice given to you by your health care provider. Make sure you discuss any questions you have with your health care provider. °Document Released: 03/13/2005 Document Revised: 11/09/2015 Document Reviewed: 02/08/2013 °Elsevier Interactive Patient Education © 2018 [employer] ° °

## 2017-11-16 NOTE — Progress Notes (Signed)
G3P1 @ 13.[redacted] wksga. Here dt spot bleeding that started this morning. Denies intercourse. Denies LOF.

## 2017-11-16 NOTE — MAU Note (Signed)
Saw picture of what she passed when using restroom earlier today, was white with a reddish spot in the middle. approx 1 in long per pt.

## 2017-11-16 NOTE — MAU Note (Signed)
Last night around 1130, after she peed, she noted some spots of blood on toilet paper, again at 0100, less the 2nd time. No further bleeding noted today. Did see ? Something in the toilet, ? Clot, d/c or tissue- states was white.  denies pain.

## 2017-11-17 LAB — GC/CHLAMYDIA PROBE AMP (~~LOC~~) NOT AT ARMC
Chlamydia: NEGATIVE
Neisseria Gonorrhea: NEGATIVE

## 2017-11-19 ENCOUNTER — Encounter: Payer: Self-pay | Admitting: *Deleted

## 2017-11-25 ENCOUNTER — Other Ambulatory Visit: Payer: Medicaid Other

## 2017-12-04 ENCOUNTER — Other Ambulatory Visit: Payer: Medicaid Other

## 2017-12-11 ENCOUNTER — Encounter: Payer: Self-pay | Admitting: Obstetrics & Gynecology

## 2017-12-11 ENCOUNTER — Ambulatory Visit (INDEPENDENT_AMBULATORY_CARE_PROVIDER_SITE_OTHER): Payer: Medicaid Other | Admitting: Obstetrics & Gynecology

## 2017-12-11 VITALS — BP 111/72 | HR 93 | Wt 132.1 lb

## 2017-12-11 DIAGNOSIS — O24410 Gestational diabetes mellitus in pregnancy, diet controlled: Secondary | ICD-10-CM

## 2017-12-11 DIAGNOSIS — O0990 Supervision of high risk pregnancy, unspecified, unspecified trimester: Secondary | ICD-10-CM

## 2017-12-11 NOTE — Patient Instructions (Signed)
Return to clinic for any scheduled appointments or obstetric concerns, or go to MAU for evaluation  

## 2017-12-11 NOTE — Progress Notes (Signed)
   PRENATAL VISIT NOTE  Subjective:  Gloria Mendez is a 37 y.o. G3P1011 at 7943w6d being seen today for ongoing prenatal care.  She is currently monitored for the following issues for this high-risk pregnancy and has Spotting affecting pregnancy; Supervision of high risk pregnancy, antepartum; Advanced maternal age in multigravida; Hypothyroidism; History of gestational diabetes in prior pregnancy, currently pregnant; and Gestational diabetes on their problem list.  Patient reports no complaints.   . Vag. Bleeding: None.   . Denies leaking of fluid.   The following portions of the patient's history were reviewed and updated as appropriate: allergies, current medications, past family history, past medical history, past social history, past surgical history and problem list. Problem list updated.  Objective:   Vitals:   12/11/17 0944  BP: 111/72  Pulse: 93  Weight: 132 lb 1.6 oz (59.9 kg)    Fetal Status: Fetal Heart Rate (bpm): 158         General:  Alert, oriented and cooperative. Patient is in no acute distress.  Skin: Skin is warm and dry. No rash noted.   Cardiovascular: Normal heart rate noted  Respiratory: Normal respiratory effort, no problems with respiration noted  Abdomen: Soft, gravid, appropriate for gestational age.  Pain/Pressure: Absent     Pelvic: Cervical exam deferred        Extremities: Normal range of motion.  Edema: None  Mental Status: Normal mood and affect. Normal behavior. Normal judgment and thought content.              Assessment and Plan:  Pregnancy: G3P1011 at 7443w6d  1. Diet controlled gestational diabetes mellitus (GDM) in second trimester Stable CBGs on diet and exercise control.   2. Supervision of high risk pregnancy, antepartum Normal NIPS, anatomy scan already scheduled. - AFP, Serum, Open Spina Bifida No other complaints or concerns.  Routine obstetric precautions reviewed. Please refer to After Visit Summary for other counseling  recommendations.  Return in about 1 month (around 01/08/2018) for OB Visit.  Future Appointments  Date Time Provider Department Center  12/16/2017 10:00 AM [facility]-EDUCATION [facility]  12/25/2017  8:30 AM [facility] US 1 [facility]    Jaynie CollinsUgonna Destyn Schuyler, MD

## 2017-12-13 LAB — AFP, SERUM, OPEN SPINA BIFIDA
AFP MoM: 0.91
AFP Value: 36.2 ng/mL
GEST. AGE ON COLLECTION DATE: 16.9 wk
MATERNAL AGE AT EDD: 36.1 a
OSBR Risk 1 IN: 10000
TEST RESULTS AFP: NEGATIVE
Weight: 132 [lb_av]

## 2017-12-16 ENCOUNTER — Encounter: Payer: Medicaid Other | Attending: Obstetrics & Gynecology | Admitting: *Deleted

## 2017-12-16 ENCOUNTER — Ambulatory Visit: Payer: Medicaid Other | Admitting: *Deleted

## 2017-12-16 DIAGNOSIS — O24410 Gestational diabetes mellitus in pregnancy, diet controlled: Secondary | ICD-10-CM | POA: Insufficient documentation

## 2017-12-16 DIAGNOSIS — Z3A00 Weeks of gestation of pregnancy not specified: Secondary | ICD-10-CM | POA: Diagnosis not present

## 2017-12-16 DIAGNOSIS — Z713 Dietary counseling and surveillance: Secondary | ICD-10-CM | POA: Insufficient documentation

## 2017-12-16 NOTE — Progress Notes (Signed)
  Patient was seen on 12/16/2017 for Gestational Diabetes self-management. EDD 05/22/2018. Patient states history of GDM with her last pregnancy 3 years ago, when she lived in [place]. She also reports plans to move to [place], [place] in August this year to be closer to family.  Diet history obtained. Patient eats good variety of all food groups although I feel she is not eating enough total calories for adequate nutrition during pregnancy. Beverages include only water. She states she walks for about 20 minutes daily after lunch and supper The following learning objectives were met by the patient :   States the definition of Gestational Diabetes  States why dietary management is important in controlling blood glucose  Describes the effects of carbohydrates on blood glucose levels  Demonstrates ability to create a balanced meal plan  Demonstrates carbohydrate counting   States when to check blood glucose levels  Demonstrates proper blood glucose monitoring techniques  States the effect of stress and exercise on blood glucose levels  States the importance of limiting caffeine and abstaining from alcohol and smoking  Plan:  Aim for 2-3 Carb Choices per meal (30-45 grams)  Aim for 1-2 Carbs per snack Begin reading food labels for Total Carbohydrate of foods Continue with your activity level by walking or other activity daily as tolerated Begin checking BG before breakfast and 2 hours after first bite of breakfast, lunch and dinner as directed by MD  Bring Log Book/Sheet to every medical appointment   Patient was introduced to [employer] and is already using it as record of BG electronically  Take medication if directed by MD  Patient already has a meter: Accu Chek Guide And is testing pre breakfast and 2 hours each meal as directed by MD Review of Log Book shows: majority of FBG and post meal BG within target ranges  Patient instructed to monitor glucose levels: FBS: 60 - 95 mg/dl 2  hour: <120 mg/dl  Patient received the following handouts:  Nutrition Diabetes and Pregnancy  Carbohydrate Counting List  Patient will be seen for follow-up as needed.

## 2017-12-17 ENCOUNTER — Encounter (HOSPITAL_COMMUNITY): Payer: Self-pay

## 2017-12-25 ENCOUNTER — Other Ambulatory Visit: Payer: Self-pay | Admitting: Advanced Practice Midwife

## 2017-12-25 ENCOUNTER — Ambulatory Visit (HOSPITAL_COMMUNITY)
Admission: RE | Admit: 2017-12-25 | Discharge: 2017-12-25 | Disposition: A | Discharge status: Home / Self Care | Payer: Medicaid Other | Source: Ambulatory Visit | Attending: Advanced Practice Midwife | Admitting: Advanced Practice Midwife

## 2017-12-25 ENCOUNTER — Encounter (HOSPITAL_COMMUNITY): Payer: Self-pay

## 2017-12-25 DIAGNOSIS — Z3A18 18 weeks gestation of pregnancy: Secondary | ICD-10-CM | POA: Diagnosis not present

## 2017-12-25 DIAGNOSIS — O09522 Supervision of elderly multigravida, second trimester: Secondary | ICD-10-CM | POA: Diagnosis not present

## 2017-12-25 DIAGNOSIS — Z363 Encounter for antenatal screening for malformations: Secondary | ICD-10-CM | POA: Diagnosis present

## 2017-12-25 DIAGNOSIS — O24410 Gestational diabetes mellitus in pregnancy, diet controlled: Secondary | ICD-10-CM

## 2017-12-25 DIAGNOSIS — O358XX0 Maternal care for other (suspected) fetal abnormality and damage, not applicable or unspecified: Secondary | ICD-10-CM | POA: Insufficient documentation

## 2017-12-25 DIAGNOSIS — O0990 Supervision of high risk pregnancy, unspecified, unspecified trimester: Secondary | ICD-10-CM

## 2017-12-25 DIAGNOSIS — O24419 Gestational diabetes mellitus in pregnancy, unspecified control: Secondary | ICD-10-CM

## 2017-12-25 DIAGNOSIS — E039 Hypothyroidism, unspecified: Secondary | ICD-10-CM

## 2017-12-25 DIAGNOSIS — O99282 Endocrine, nutritional and metabolic diseases complicating pregnancy, second trimester: Secondary | ICD-10-CM

## 2017-12-25 DIAGNOSIS — Z79890 Hormone replacement therapy (postmenopausal): Secondary | ICD-10-CM | POA: Diagnosis not present

## 2018-01-08 ENCOUNTER — Ambulatory Visit (INDEPENDENT_AMBULATORY_CARE_PROVIDER_SITE_OTHER): Payer: Medicaid Other | Admitting: Obstetrics and Gynecology

## 2018-01-08 ENCOUNTER — Encounter: Payer: Self-pay | Admitting: Obstetrics and Gynecology

## 2018-01-08 VITALS — BP 108/69 | HR 94 | Wt 136.1 lb

## 2018-01-08 DIAGNOSIS — O0990 Supervision of high risk pregnancy, unspecified, unspecified trimester: Secondary | ICD-10-CM

## 2018-01-08 DIAGNOSIS — O09529 Supervision of elderly multigravida, unspecified trimester: Secondary | ICD-10-CM

## 2018-01-08 DIAGNOSIS — Z3A20 20 weeks gestation of pregnancy: Secondary | ICD-10-CM

## 2018-01-08 DIAGNOSIS — Z8632 Personal history of gestational diabetes: Secondary | ICD-10-CM

## 2018-01-08 DIAGNOSIS — O4440 Low lying placenta NOS or without hemorrhage, unspecified trimester: Secondary | ICD-10-CM | POA: Insufficient documentation

## 2018-01-08 DIAGNOSIS — O4442 Low lying placenta NOS or without hemorrhage, second trimester: Secondary | ICD-10-CM

## 2018-01-08 DIAGNOSIS — O99282 Endocrine, nutritional and metabolic diseases complicating pregnancy, second trimester: Secondary | ICD-10-CM

## 2018-01-08 DIAGNOSIS — O09299 Supervision of pregnancy with other poor reproductive or obstetric history, unspecified trimester: Secondary | ICD-10-CM

## 2018-01-08 DIAGNOSIS — O0992 Supervision of high risk pregnancy, unspecified, second trimester: Secondary | ICD-10-CM

## 2018-01-08 DIAGNOSIS — E039 Hypothyroidism, unspecified: Secondary | ICD-10-CM

## 2018-01-08 DIAGNOSIS — O09292 Supervision of pregnancy with other poor reproductive or obstetric history, second trimester: Secondary | ICD-10-CM

## 2018-01-08 DIAGNOSIS — O09522 Supervision of elderly multigravida, second trimester: Secondary | ICD-10-CM

## 2018-01-08 DIAGNOSIS — O24410 Gestational diabetes mellitus in pregnancy, diet controlled: Secondary | ICD-10-CM | POA: Diagnosis not present

## 2018-01-08 NOTE — Progress Notes (Signed)
   PRENATAL VISIT NOTE  Subjective:  Gloria Mendez is a 37 y.o. G3P1011 at 725w6d being seen today for ongoing prenatal care.  She is currently monitored for the following issues for this high-risk pregnancy and has Spotting affecting pregnancy; Supervision of high risk pregnancy, antepartum; Advanced maternal age in multigravida; Hypothyroidism; History of gestational diabetes in prior pregnancy, currently pregnant; Gestational diabetes; and Low-lying placenta on their problem list.  Patient reports no complaints.  Contractions: Not present. Vag. Bleeding: None.  Movement: Present. Denies leaking of fluid.   The following portions of the patient's history were reviewed and updated as appropriate: allergies, current medications, past family history, past medical history, past social history, past surgical history and problem list. Problem list updated.  Objective:   Vitals:   01/08/18 1027  BP: 108/69  Pulse: 94  Weight: 136 lb 1.6 oz (61.7 kg)    Fetal Status: Fetal Heart Rate (bpm): 156   Movement: Present     General:  Alert, oriented and cooperative. Patient is in no acute distress.  Skin: Skin is warm and dry. No rash noted.   Cardiovascular: Normal heart rate noted  Respiratory: Normal respiratory effort, no problems with respiration noted  Abdomen: Soft, gravid, appropriate for gestational age.  Pain/Pressure: Absent     Pelvic: Cervical exam deferred        Extremities: Normal range of motion.  Edema: None  Mental Status: Normal mood and affect. Normal behavior. Normal judgment and thought content.   Assessment and Plan:  Pregnancy: G3P1011 at 565w6d  1. Supervision of high risk pregnancy, antepartum  2. Hypothyroidism, unspecified type Repeat TSH/fT4 today  3. Diet controlled gestational diabetes mellitus (GDM) in second trimester FG: majority < 95 PP: all under 120 Encouraged her to continue diet control  4. Antepartum multigravida of advanced maternal age  285.  History of gestational diabetes in prior pregnancy, currently pregnant   Patient and family are moving to [place], encouraged her to establish care ASAP.  Preterm labor symptoms and general obstetric precautions including but not limited to vaginal bleeding, contractions, leaking of fluid and fetal movement were reviewed in detail with the patient. Please refer to After Visit Summary for other counseling recommendations.  Return in about 1 month (around 02/05/2018) for OB visit (MD).  No future appointments.  Conan BowensKelly M Roylene Heaton, MD

## 2018-01-09 LAB — TSH: TSH: 1.7 u[IU]/mL (ref 0.450–4.500)

## 2018-01-09 LAB — T4, FREE: FREE T4: 0.92 ng/dL (ref 0.82–1.77)

## 2018-02-09 NOTE — Progress Notes (Unsigned)
Received Alert from [facility] Regarding Glucose levels:

## 2018-02-10 NOTE — Progress Notes (Signed)
Will have patient come in for OB visit if she is still in [place], last visit [place] was in process [place]. If [place], she will need to establish care with OB ASAP.

## 2018-03-09 NOTE — Progress Notes (Signed)
Received alert from [employer] Regarding this pt.: No more recordings after 02/27/18.

## 2018-08-14 ENCOUNTER — Encounter (HOSPITAL_COMMUNITY): Payer: Self-pay

## 2020-01-31 IMAGING — US US OB < 14 WEEKS - US OB TV
1 series · 15 of 28 positions shown · non-contrast
Comparison: None.

CLINICAL DATA: Pregnant patient with vaginal spotting. Patient is 5
weeks and 2 days pregnant based on her last menstrual period.

EXAM:
OBSTETRIC <14 WK US AND TRANSVAGINAL OB US
TECHNIQUE: Both transabdominal and transvaginal ultrasound examinations were
performed for complete evaluation of the gestation as well as the
maternal uterus, adnexal regions, and pelvic cul-de-sac.
Transvaginal technique was performed to assess early pregnancy.

[Series 1: us ob < 14 weeks - us ob tv · 15 of 47 slices shown]
[im 1/47]
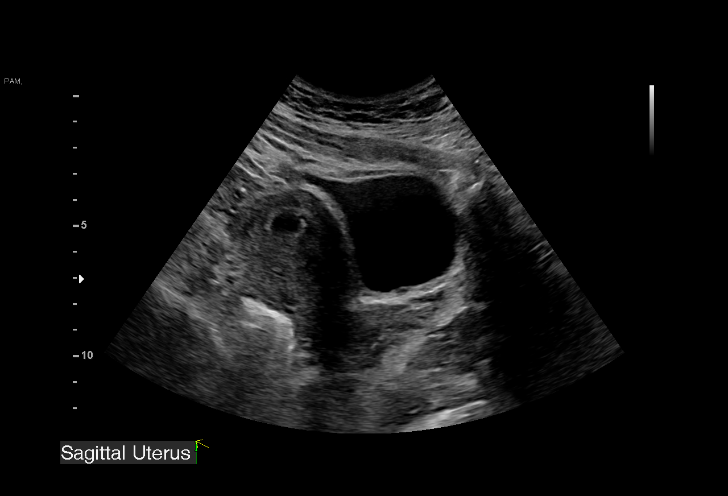
[im 4/47]
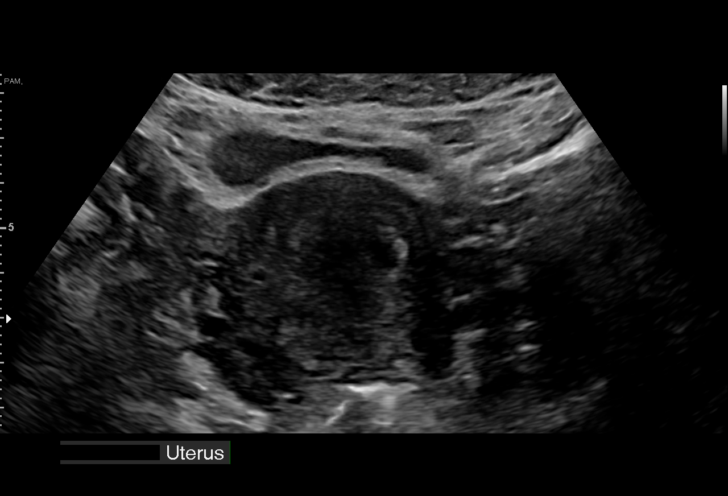
[im 7/47]
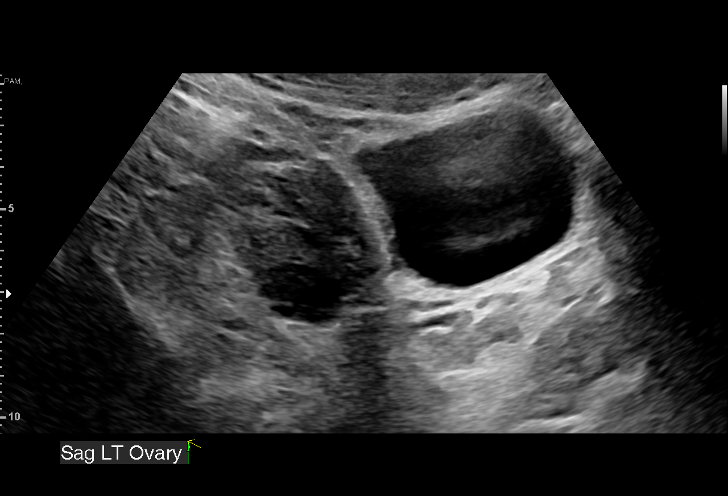
[im 11/47]
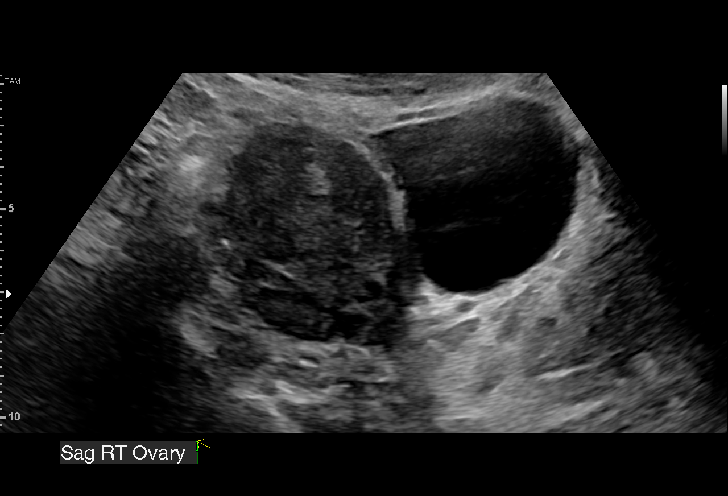
[im 14/47]
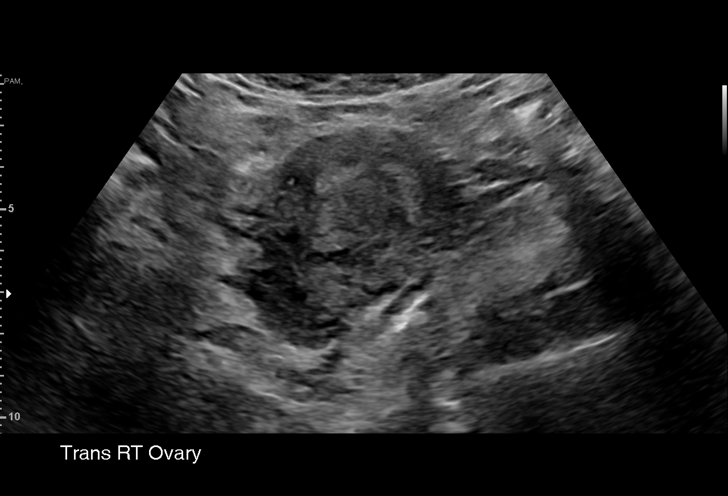
[im 18/47]
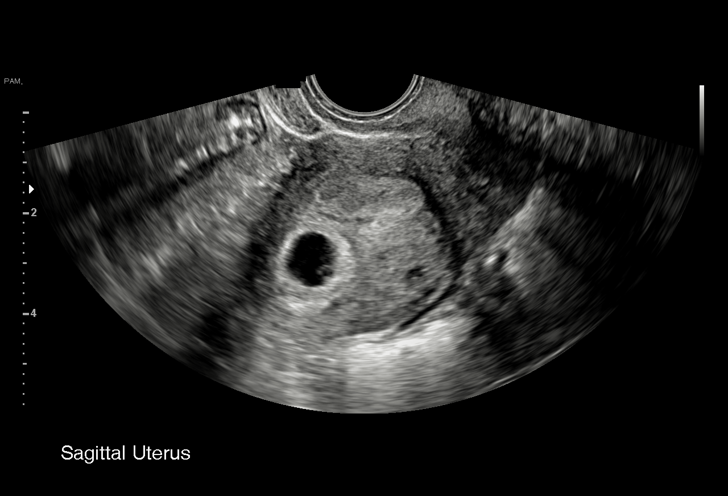
[im 21/47]
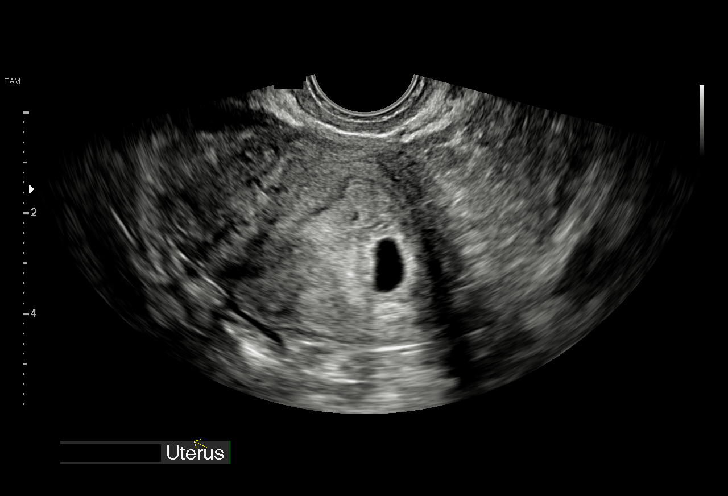
[im 24/47]
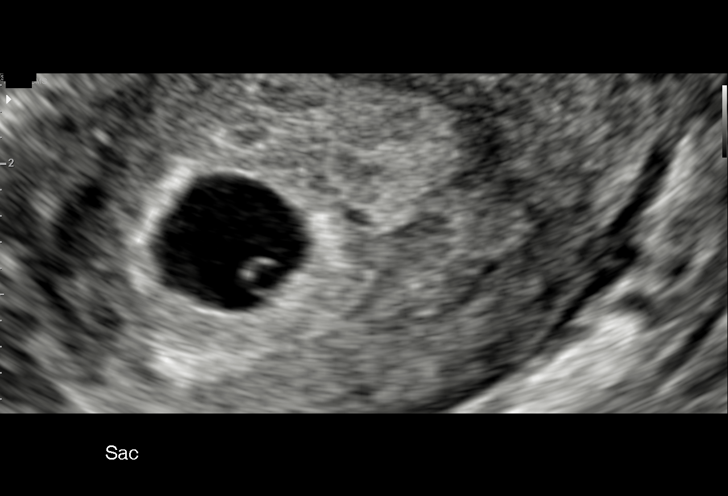
[im 26/47]
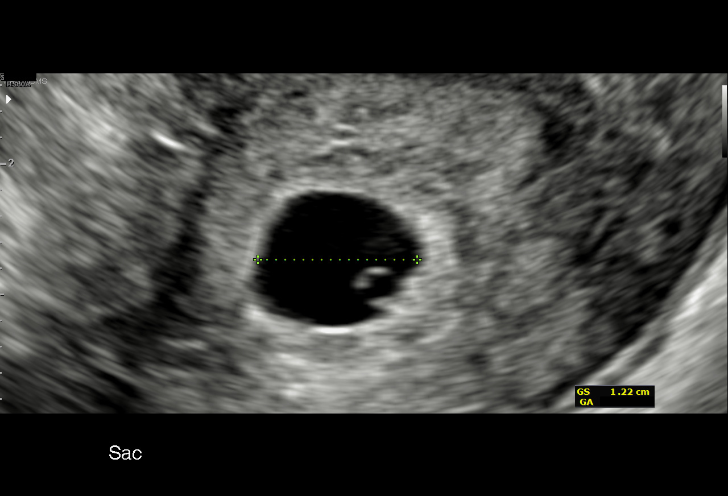
[im 29/47]
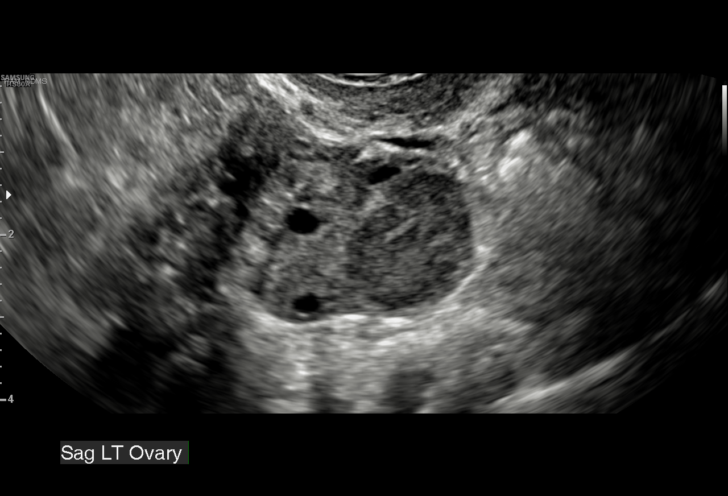
[im 33/47]
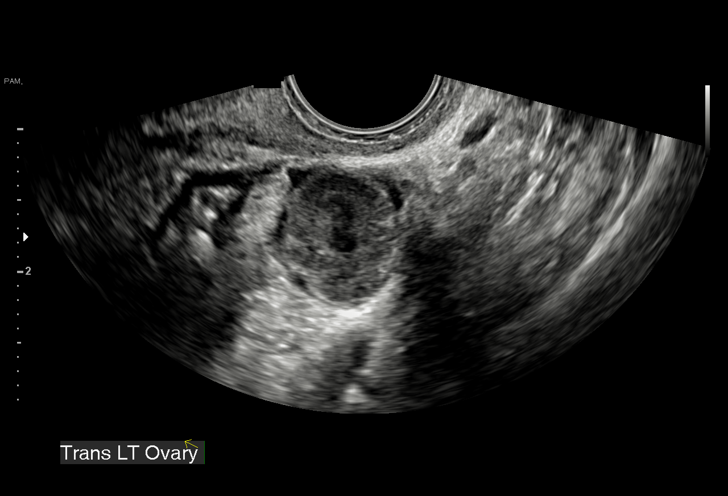
[im 36/47]
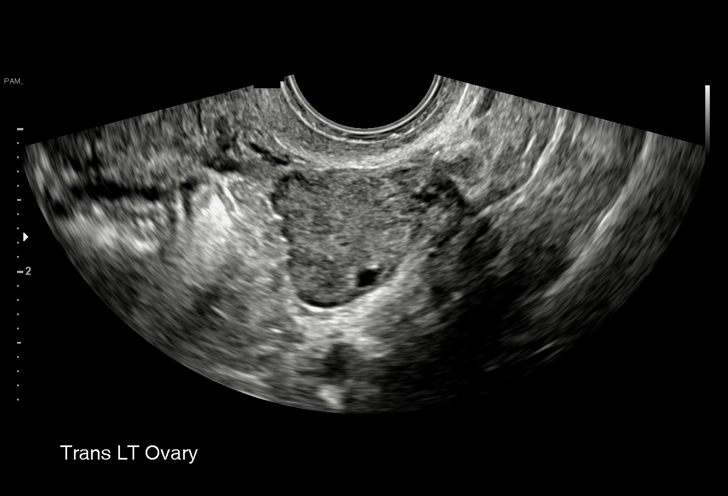
[im 40/47]
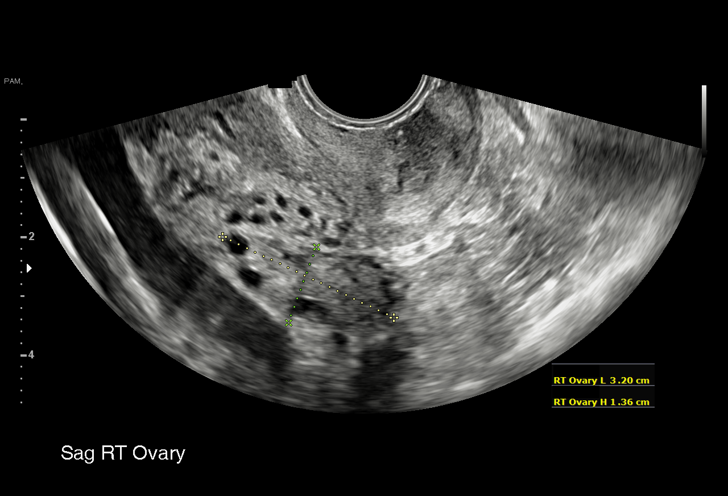
[im 43/47]
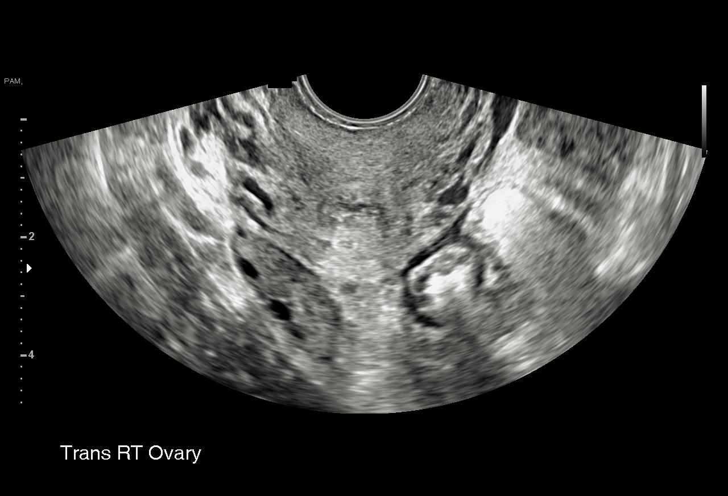
[im 47/47]
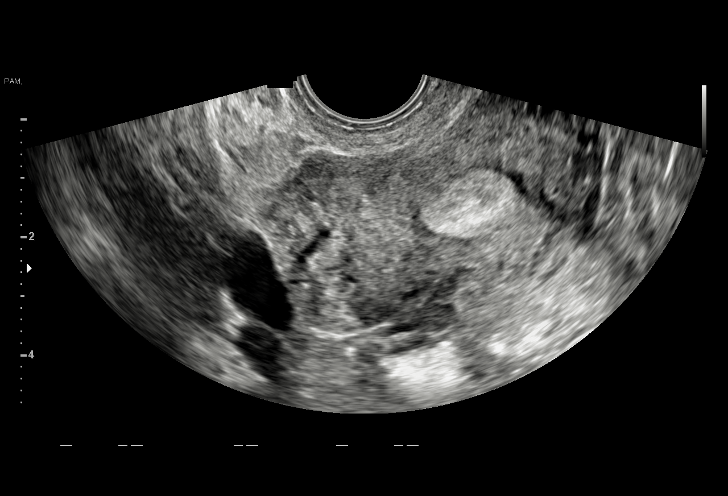

[15 of 28 positions shown; findings below may reference images not displayed]

FINDINGS: Intrauterine gestational sac: Single

Yolk sac:  Visualized.

Embryo:  Not Visualized.

Cardiac Activity: Not Visualized.

MSD: 8.3 mm mm   5 w   4 d

Subchorionic hemorrhage:  None visualized.

Maternal uterus/adnexae: No uterine masses. Cervix is closed. Left
ovary demonstrates a corpus luteum. Ovaries otherwise unremarkable.
No adnexal masses. No pelvic free fluid.
IMPRESSION: 1. Intrauterine gestational sac containing a yolk sac consistent
with an early intrauterine pregnancy. Embryo not visualized.
Recommend follow-up ultrasound in 7-10 days to document normal
pregnancy progression.
2. No subchronic hemorrhage or other evidence of a pregnancy
complication.
